# Patient Record
Sex: Male | Born: 1995 | Race: White | Hispanic: No | Marital: Single | Smoking: Current every day smoker
Health system: Southern US, Community
[De-identification: ages and names within clinical notes are randomized; demographics above are authoritative.]

## PROBLEM LIST (undated history)

## (undated) HISTORY — PX: NO PAST SURGERIES: SHX2092

---

## 2017-07-04 ENCOUNTER — Ambulatory Visit (INDEPENDENT_AMBULATORY_CARE_PROVIDER_SITE_OTHER): Payer: Self-pay | Admitting: Allergy and Immunology

## 2017-07-04 ENCOUNTER — Encounter: Payer: Self-pay | Admitting: Allergy and Immunology

## 2017-07-04 VITALS — BP 142/64 | HR 64 | Temp 98.1°F | Resp 20 | Ht 72.0 in | Wt 157.0 lb

## 2017-07-04 DIAGNOSIS — Z880 Allergy status to penicillin: Secondary | ICD-10-CM

## 2017-07-04 MED ORDER — PENICILLIN V POTASSIUM 125 MG/5ML PO SOLR: ORAL | 0 refills | Status: DC

## 2017-07-04 NOTE — Progress Notes (Signed)
NEW PATIENT NOTE  Referring Provider: No ref. provider found Primary Provider: No primary care provider on file. Date of office visit: 07/04/2017    Subjective:   Chief Complaint:  Kevin Burton (DOB: January 24, 1996) is a 21 y.o. male who presents to the clinic on 07/04/2017 with a chief complaint of No chief complaint on file. Marland Kitchen  HPI: Kevin Burton presents to this clinic in evaluation of possible penicillin allergy. He has never had a reaction to penicillin administration and he is not sure that he has ever had administration of a antibiotic related to penicillin. Basically because of a strong family history of penicillin hypersensitivity he has never had exposure to penicillin. He does not have an atopic history. He is applying to the [employer]	[place] and this issue of possible penicillin allergy must be rectified.  History reviewed. No pertinent past medical history.  Past Surgical History:  Procedure Laterality Date  . NO PAST SURGERIES      Allergies as of 07/04/2017      Reactions   Penicillins    Family history      Medication List      none   Review of systems negative except as noted in HPI / PMHx or noted below:  Review of Systems  Constitutional: Negative.   HENT: Negative.   Eyes: Negative.   Respiratory: Negative.   Cardiovascular: Negative.   Gastrointestinal: Negative.   Genitourinary: Negative.   Musculoskeletal: Negative.   Skin: Negative.   Neurological: Negative.   Endo/Heme/Allergies: Negative.   Psychiatric/Behavioral: Negative.     Family History  Problem Relation Age of Onset  . Other Brother        penicillin allergy-severe anaphylactic reaction    Social History   Social History  . Marital status: N/A    Spouse name: N/A  . Number of children: N/A  . Years of education: N/A   Occupational History  . Not on file.   Social History Main Topics  . Smoking status: Current Every Day Smoker    Packs/day: 0.25    Years: 3.00    Types:  Cigarettes  . Smokeless tobacco: Never Used  . Alcohol use No  . Drug use: No  . Sexual activity: Not on file   Other Topics Concern  . Not on file   Social History Narrative  . No narrative on file    Environmental and Social history  Lives in a house with a dry environment, dogs located inside the household, carpeting in the bedroom, no plastic on the bed, no plastic on the pillow, actually smoking tobacco products, and employment as un-armed security.  Objective:   Vitals:   07/04/17 1354  BP: (!) 142/64  Pulse: 64  Resp: 20  Temp: 98.1 F (36.7 C)   Height: 6' (182.9 cm) Weight: 157 lb (71.2 kg)  Physical Exam  Constitutional: He is well-developed, well-nourished, and in no distress.  HENT:  Head: Normocephalic. Head is without right periorbital erythema and without left periorbital erythema.  Right Ear: Tympanic membrane, external ear and ear canal normal.  Left Ear: Tympanic membrane, external ear and ear canal normal.  Nose: Nose normal. No mucosal edema or rhinorrhea.  Mouth/Throat: Uvula is midline, oropharynx is clear and moist and mucous membranes are normal. No oropharyngeal exudate.  Eyes: Pupils are equal, round, and reactive to light. Conjunctivae and lids are normal.  Neck: Trachea normal. No tracheal tenderness present. No tracheal deviation present. No thyromegaly present.  Cardiovascular: Normal  rate, regular rhythm, S1 normal, S2 normal and normal heart sounds.   No murmur heard. Pulmonary/Chest: Effort normal and breath sounds normal. No stridor. No tachypnea. No respiratory distress. He has no wheezes. He has no rales. He exhibits no tenderness.  Abdominal: Soft. He exhibits no distension and no mass. There is no hepatosplenomegaly. There is no tenderness. There is no rebound and no guarding.  Musculoskeletal: He exhibits no edema or tenderness.  Lymphadenopathy:       Head (right side): No tonsillar adenopathy present.       Head (left side): No  tonsillar adenopathy present.    He has no cervical adenopathy.    He has no axillary adenopathy.  Neurological: He is alert. Gait normal.  Skin: No rash noted. He is not diaphoretic. No erythema. No pallor. Nails show no clubbing.  Psychiatric: Mood and affect normal.    Diagnostics:  none   Assessment and Plan:    1. History of penicillin allergy    I think the best way to work through this issue is to have Kevin Burton undergo a penicillin oral challenge in the clinic and we will get that arranged this week. Given the lack of a history suggesting penicillin allergy I think we can move right to an incremental drug challenge rather than working through penicillin sensitivity with skin testing.  Laurette SchimkeEric Akaysha Cobern, MD Allergy / Immunology [facility]

## 2017-07-06 ENCOUNTER — Encounter: Payer: Self-pay | Admitting: Allergy and Immunology

## 2017-07-09 ENCOUNTER — Ambulatory Visit: Payer: Self-pay | Admitting: Allergy and Immunology

## 2017-07-09 ENCOUNTER — Encounter: Payer: Self-pay | Admitting: Allergy and Immunology

## 2017-07-09 VITALS — BP 124/80 | HR 60 | Resp 18

## 2017-07-09 DIAGNOSIS — Z880 Allergy status to penicillin: Secondary | ICD-10-CM

## 2017-07-09 NOTE — Progress Notes (Signed)
Kevin Burton returns to this clinic to have a oral penicillin challenge administered. Utilizing an incremental challenge protocol he was given pen VK over the course of 3 hours with a total exposure of 500 milligrams without the development of any adverse effect. The label of penicillin allergy should be removed from his medical chart.

## 2017-07-11 ENCOUNTER — Encounter: Payer: Self-pay | Admitting: *Deleted

## 2019-09-13 ENCOUNTER — Other Ambulatory Visit: Payer: Self-pay

## 2019-09-13 ENCOUNTER — Emergency Department

## 2019-09-13 ENCOUNTER — Emergency Department
Admission: EM | Admit: 2019-09-13 | Discharge: 2019-09-13 | Disposition: A | Discharge status: Home / Self Care | Attending: Emergency Medicine | Admitting: Emergency Medicine

## 2019-09-13 DIAGNOSIS — S0990XA Unspecified injury of head, initial encounter: Secondary | ICD-10-CM | POA: Insufficient documentation

## 2019-09-13 DIAGNOSIS — Y999 Unspecified external cause status: Secondary | ICD-10-CM | POA: Insufficient documentation

## 2019-09-13 DIAGNOSIS — W19XXXA Unspecified fall, initial encounter: Secondary | ICD-10-CM | POA: Insufficient documentation

## 2019-09-13 DIAGNOSIS — F17210 Nicotine dependence, cigarettes, uncomplicated: Secondary | ICD-10-CM | POA: Diagnosis not present

## 2019-09-13 DIAGNOSIS — R4182 Altered mental status, unspecified: Secondary | ICD-10-CM | POA: Insufficient documentation

## 2019-09-13 DIAGNOSIS — Y929 Unspecified place or not applicable: Secondary | ICD-10-CM | POA: Diagnosis not present

## 2019-09-13 DIAGNOSIS — Y907 Blood alcohol level of 200-239 mg/100 ml: Secondary | ICD-10-CM | POA: Diagnosis not present

## 2019-09-13 DIAGNOSIS — F10929 Alcohol use, unspecified with intoxication, unspecified: Secondary | ICD-10-CM | POA: Insufficient documentation

## 2019-09-13 DIAGNOSIS — Y939 Activity, unspecified: Secondary | ICD-10-CM | POA: Diagnosis not present

## 2019-09-13 LAB — COMPREHENSIVE METABOLIC PANEL
ALT: 52 U/L — ABNORMAL HIGH (ref 0–44)
AST: 34 U/L (ref 15–41)
Albumin: 4.8 g/dL (ref 3.5–5.0)
Alkaline Phosphatase: 73 U/L (ref 38–126)
Anion gap: 12 (ref 5–15)
BUN: 12 mg/dL (ref 6–20)
CO2: 25 mmol/L (ref 22–32)
Calcium: 8.9 mg/dL (ref 8.9–10.3)
Chloride: 102 mmol/L (ref 98–111)
Creatinine, Ser: 0.77 mg/dL (ref 0.61–1.24)
GFR calc Af Amer: >60 mL/min (ref >60)
GFR calc non Af Amer: >60 mL/min (ref >60)
Glucose, Bld: 129 mg/dL — ABNORMAL HIGH (ref 70–99)
Potassium: 3.1 mmol/L — ABNORMAL LOW (ref 3.5–5.1)
Sodium: 139 mmol/L (ref 135–145)
Total Bilirubin: 0.6 mg/dL (ref 0.3–1.2)
Total Protein: 8.1 g/dL (ref 6.5–8.1)

## 2019-09-13 LAB — CBC
HCT: 47.1 % (ref 39.0–52.0)
Hemoglobin: 16 g/dL (ref 13.0–17.0)
MCH: 29.9 pg (ref 26.0–34.0)
MCHC: 34 g/dL (ref 30.0–36.0)
MCV: 88 fL (ref 80.0–100.0)
Platelets: 229 10*3/uL (ref 150–400)
RBC: 5.35 MIL/uL (ref 4.22–5.81)
RDW: 11.4 % — ABNORMAL LOW (ref 11.5–15.5)
WBC: 10.7 10*3/uL — ABNORMAL HIGH (ref 4.0–10.5)
nRBC: 0 % (ref 0.0–0.2)

## 2019-09-13 LAB — GLUCOSE, CAPILLARY: Glucose-Capillary: 117 mg/dL — ABNORMAL HIGH (ref 70–99)

## 2019-09-13 LAB — ETHANOL: Alcohol, Ethyl (B): 233 mg/dL — ABNORMAL HIGH (ref <10)

## 2019-09-13 MED ORDER — ONDANSETRON HCL 4 MG/2ML IJ SOLN
INTRAMUSCULAR | Status: AC
  Administered 2019-09-13: 03:00:00 4 mg via INTRAVENOUS
  Filled 2019-09-13: qty 2

## 2019-09-13 MED ORDER — ONDANSETRON HCL 4 MG/2ML IJ SOLN
4.0000 mg | Freq: Once | INTRAMUSCULAR | Status: AC
  Administered 2019-09-13: 03:00:00 4 mg via INTRAVENOUS

## 2019-09-13 MED ORDER — ONDANSETRON HCL 4 MG/2ML IJ SOLN
4.0000 mg | Freq: Once | INTRAMUSCULAR | Status: AC
  Administered 2019-09-13: 4 mg via INTRAVENOUS

## 2019-09-13 MED ORDER — SODIUM CHLORIDE 0.9 % IV BOLUS
1000.0000 mL | Freq: Once | INTRAVENOUS | Status: AC
  Administered 2019-09-13: 1000 mL via INTRAVENOUS

## 2019-09-13 MED ORDER — ONDANSETRON HCL 4 MG/2ML IJ SOLN
INTRAMUSCULAR | Status: AC
  Filled 2019-09-13: qty 2

## 2019-09-13 NOTE — ED Notes (Signed)
Pt in ct vomiting. md notified, order for additional zofran received.

## 2019-09-13 NOTE — ED Notes (Signed)
Heather RN made aware. Pt to be placed in room 11 when bed available.

## 2019-09-13 NOTE — ED Provider Notes (Signed)
[facility] Emergency Department Provider Note _____________   First MD Initiated Contact with Patient 09/13/19 0228     (approximate)  I have reviewed the triage vital signs and the nursing notes.  History obtained from the patient significant other at bedside secondary to alcohol intoxication Level 5 caveat history review of system limited secondary to alcohol intoxication HISTORY  Chief Complaint Alcohol Intoxication and Head Injury   HPI Kevin Burton is a 23 y.o. male presents to the emergency department secondary to 2 falls tonight with head injury approximately 40 minutes apart.  Patient's significant other at bedside states that he has had approximately 3 beers 2 Jaeger shots and a small bottle of whiskey tonight.  She admits that he has had multiple episodes of vomiting as well.        History reviewed. No pertinent past medical history.  There are no active problems to display for this patient.   Past Surgical History:  Procedure Laterality Date  . NO PAST SURGERIES      Prior to Admission medications   Not on File    Allergies Penicillins  Family History  Problem Relation Age of Onset  . Other Brother        penicillin allergy-severe anaphylactic reaction    Social History Social History   Tobacco Use  . Smoking status: Current Every Day Smoker    Packs/day: 0.25    Years: 3.00    Pack years: 0.75    Types: Cigarettes  . Smokeless tobacco: Never Used  Substance Use Topics  . Alcohol use: Yes  . Drug use: No    Review of Systems Constitutional: No fever/chills Eyes: No visual changes. ENT: No sore throat. Cardiovascular: Denies chest pain. Respiratory: Denies shortness of breath. Gastrointestinal: No abdominal pain.  No nausea, no vomiting.  No diarrhea.  No constipation. Genitourinary: Negative for dysuria. Musculoskeletal: Negative for neck pain.  Negative for back pain. Integumentary: Negative for rash.  Neurological: Negative for headaches, focal weakness or numbness. Psychiatric:  Positive for heavy EtOH ingestion   ____________________________________________   PHYSICAL EXAM:  VITAL SIGNS: ED Triage Vitals  Enc Vitals Group     BP 09/13/19 0212 (!) 141/69     Pulse Rate 09/13/19 0212 76     Resp 09/13/19 0212 18     Temp --      Temp src --      SpO2 09/13/19 0212 98 %     Weight 09/13/19 0213 88.5 kg (195 lb)     Height 09/13/19 0213 1.88 m (6\' 2" )     Head Circumference --      Peak Flow --      Pain Score --      Pain Loc --      Pain Edu? --      Excl. in [place]? --     Constitutional: Alert, appears intoxicated, emesis noted on the patient's clothing Eyes: Conjunctivae are normal.  Head: Atraumatic. Mouth/Throat: Patient is wearing a mask. Neck: No stridor.  No meningeal signs.   Cardiovascular: Normal rate, regular rhythm. Good peripheral circulation. Grossly normal heart sounds. Respiratory: Normal respiratory effort.  No retractions. Gastrointestinal: Soft and nontender. No distention.  Musculoskeletal: No lower extremity tenderness nor edema. No gross deformities of extremities. Neurologic: No gross focal neurologic deficits are appreciated.  Skin:  Skin is warm, dry and intact. Psychiatric: Appears intoxicated  ____________________________________________   LABS (all labs ordered are listed, but only abnormal results are displayed)  Labs Reviewed  CBC - Abnormal; Notable for the following components:      Result Value   WBC 10.7 (*)    RDW 11.4 (*)    All other components within normal limits  ETHANOL - Abnormal; Notable for the following components:   Alcohol, Ethyl (B) 233 (*)    All other components within normal limits  GLUCOSE, CAPILLARY - Abnormal; Notable for the following components:   Glucose-Capillary 117 (*)    All other components within normal limits  COMPREHENSIVE METABOLIC PANEL   ____________________________________________  EKG   ED ECG REPORT I, [place] N Sheneka Schrom, the attending physician, personally viewed and interpreted this ECG.   Date: 09/13/2019  EKG Time: 2:29 AM  Rate: 85  Rhythm: Normal Sinus Rhythm  Axis: Normal  Intervals:Normal   ST&T Change: None  ____________________________________________  RADIOLOGY I, [place] N Demba Nigh, personally viewed and evaluated these images (plain radiographs) as part of my medical decision making, as well as reviewing the written report by the radiologist.  ED MD interpretation: Normal head CT per radiologist.  Official radiology report(s): Ct Head Wo Contrast  Result Date: 09/13/2019 CLINICAL DATA:  Recent fall while intoxicated EXAM: CT HEAD WITHOUT CONTRAST TECHNIQUE: Contiguous axial images were obtained from the base of the skull through the vertex without intravenous contrast. COMPARISON:  None. FINDINGS: Brain: No evidence of acute infarction, hemorrhage, hydrocephalus, extra-axial collection or mass lesion/mass effect. Vascular: No hyperdense vessel or unexpected calcification. Skull: Normal. Negative for fracture or focal lesion. Sinuses/Orbits: No acute finding. Other: None. IMPRESSION: Normal head CT Electronically Signed   By: Alcide CleverMark  Lukens M.D.   On: 09/13/2019 03:21      Procedures   ____________________________________________   INITIAL IMPRESSION / MDM / ASSESSMENT AND PLAN / ED COURSE  As part of my medical decision making, I reviewed the following data within the electronic MEDICAL RECORD NUMBER  23 year old male presenting with above-stated history and physical exam secondary to resumed EtOH intoxication and falls with head injury.  CT scan of the head revealed no acute intracranial abnormality.  Patient's EtOH level 233.  Patient had additional episodes of vomiting in the emergency department for which he received in total of 2 doses of Zofran 4 mg IV no vomiting at present.  Patient also received 1 L IV normal saline resting comfortably at present   ____________________________________________  FINAL CLINICAL IMPRESSION(S) / ED DIAGNOSES  Final diagnoses:  Alcoholic intoxication with complication (HCC)     MEDICATIONS GIVEN DURING THIS VISIT:  Medications  sodium chloride 0.9 % bolus 1,000 mL (1,000 mLs Intravenous New Bag/Given 09/13/19 0243)  ondansetron (ZOFRAN) injection 4 mg (4 mg Intravenous Given 09/13/19 0231)  ondansetron (ZOFRAN) injection 4 mg (4 mg Intravenous Given 09/13/19 0310)     ED Discharge Orders    None      *Please note:  Velda ShellJames Lindholm was evaluated in Emergency Department on 09/13/2019 for the symptoms described in the history of present illness. He was evaluated in the context of the global COVID-19 pandemic, which necessitated consideration that the patient might be at risk for infection with the SARS-CoV-2 virus that causes COVID-19. Institutional protocols and algorithms that pertain to the evaluation of patients at risk for COVID-19 are in a state of rapid change based on information released by regulatory bodies including the CDC and federal and state organizations. These policies and algorithms were followed during the patient's care in the ED.  Some ED evaluations and interventions may be delayed as a result  of limited staffing during the pandemic.*  Note:  This document was prepared using Dragon voice recognition software and may include unintentional dictation errors.   Darci Current, MD 09/13/19 [phone]

## 2019-09-13 NOTE — ED Triage Notes (Addendum)
Pt arrives to ED via POV from home with c/o ETOH intoxication and head injury after 2 falls "40 minutes apart". Unknown LOC d/t pt's level of intoxication. Pt's wife reports pt drank "2-3 beers, 2 Yeager-shots, and a small bottle of whiskey". Wife reports 6 episodes of emesis PTA. Pt is snoring, unconscious, severely intoxicated and unable to provide any information.

## 2020-03-17 IMAGING — CT CT HEAD W/O CM
3 series · 16 of 46 positions shown, 19 images · non-contrast
Comparison: None.

CLINICAL DATA: Recent fall while intoxicated

EXAM:
CT HEAD WITHOUT CONTRAST
TECHNIQUE: Contiguous axial images were obtained from the base of the skull
through the vertex without intravenous contrast.

[Series 3: head wo · axial · 0.43mm/px · z∈[-79,+41]mm · 10 of 29 slices shown, 13 images]
[im 3/29  brain]
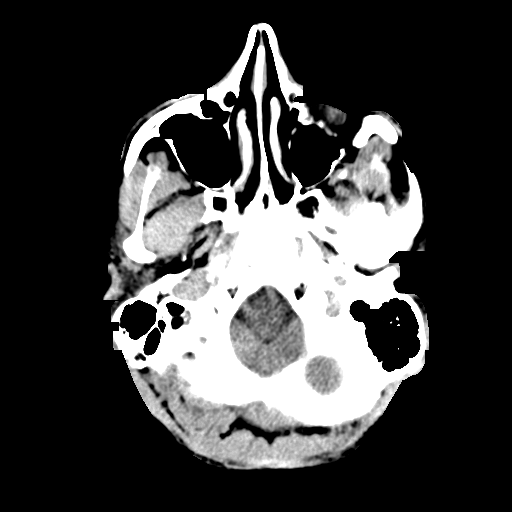
[im 3/29  bone]
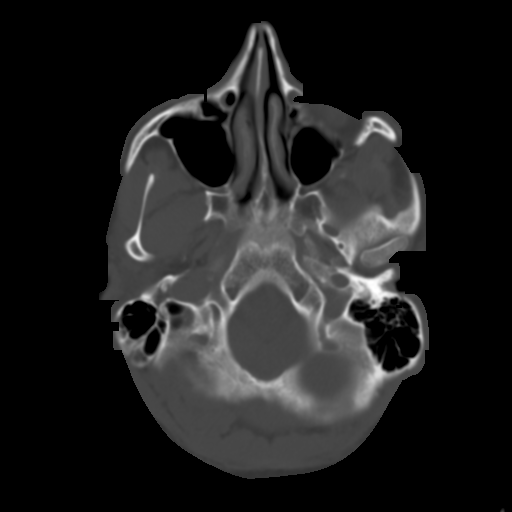
[im 6/29  brain]
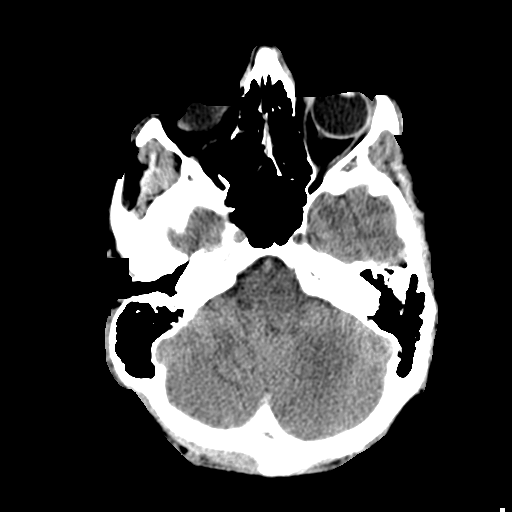
[im 8/29  brain]
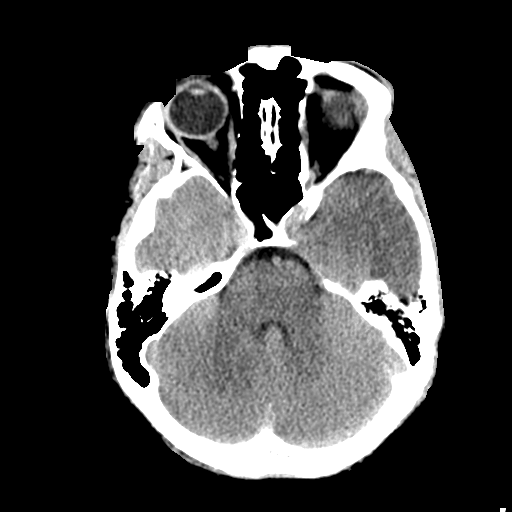
[im 11/29  brain]
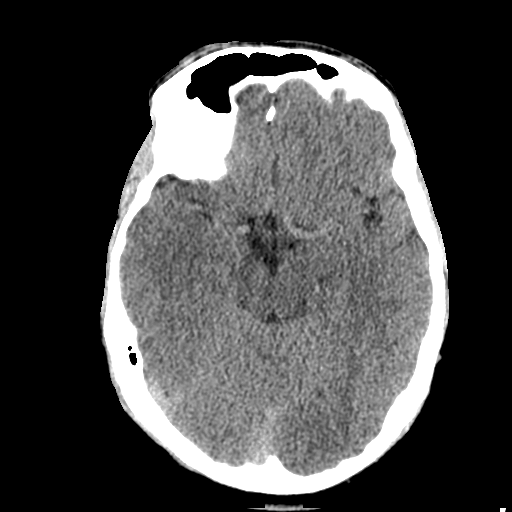
[im 14/29  brain]
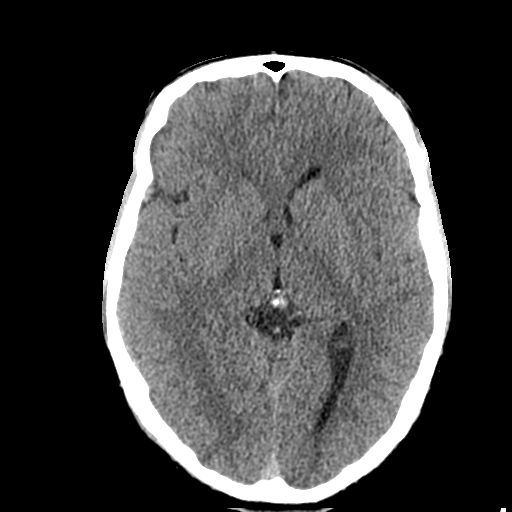
[im 14/29  bone]
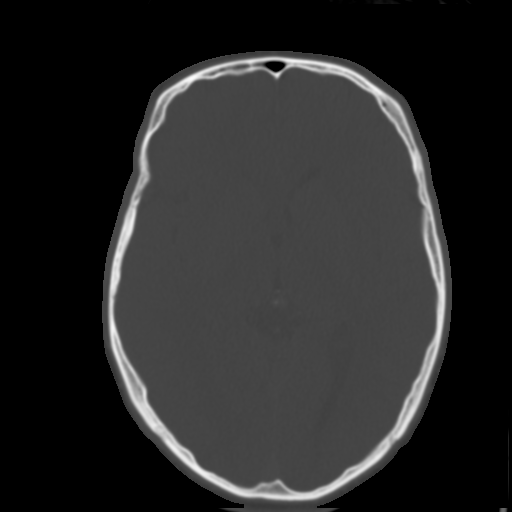
[im 16/29  brain]
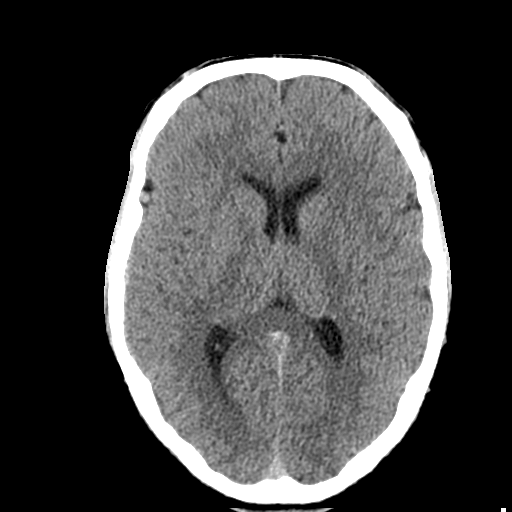
[im 19/29  brain]
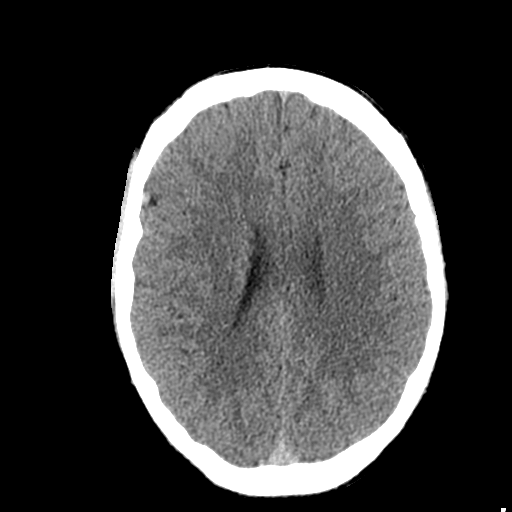
[im 22/29  brain]
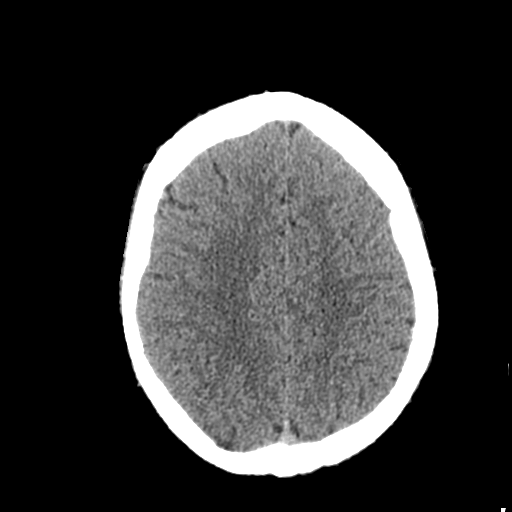
[im 24/29  brain]
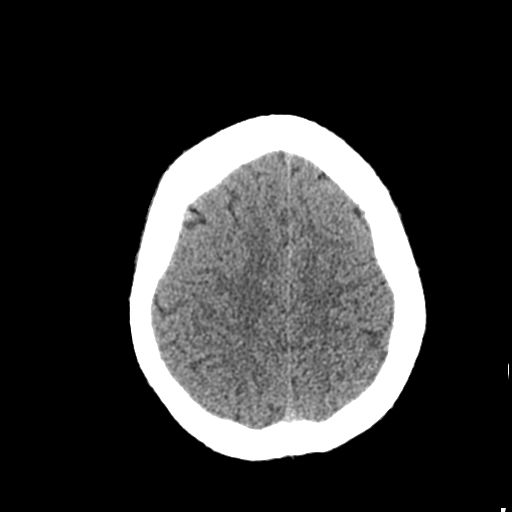
[im 24/29  bone]
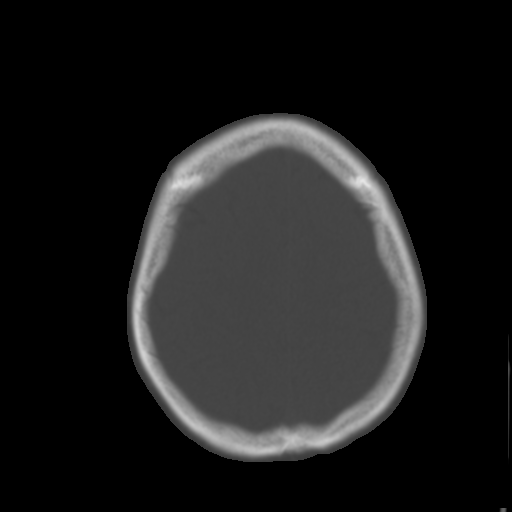
[im 27/29  brain]
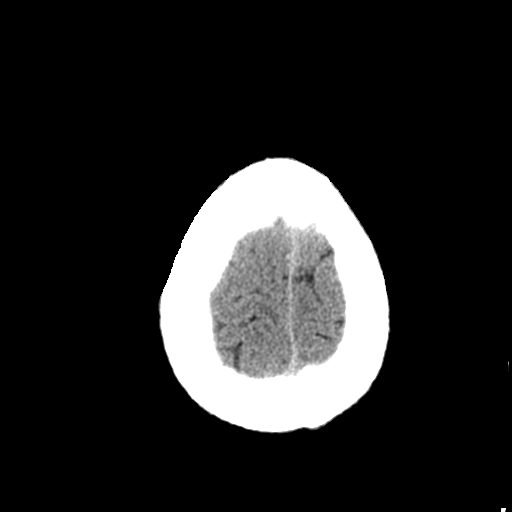

[Series 4: coronal soft tissue · coronal · 0.32mm/px · 3 of 65 slices shown]
[im 22/65  brain]
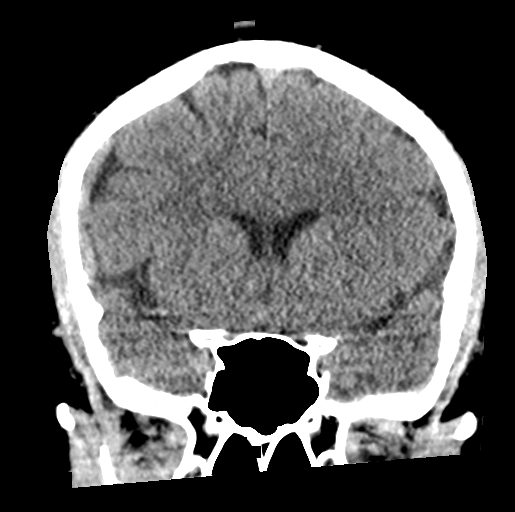
[im 29/65  brain]
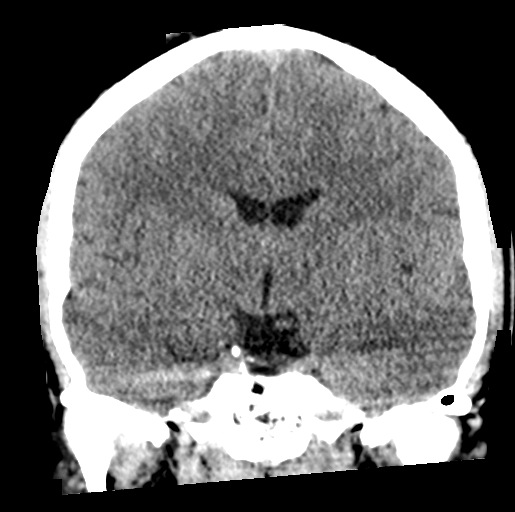
[im 36/65  brain]
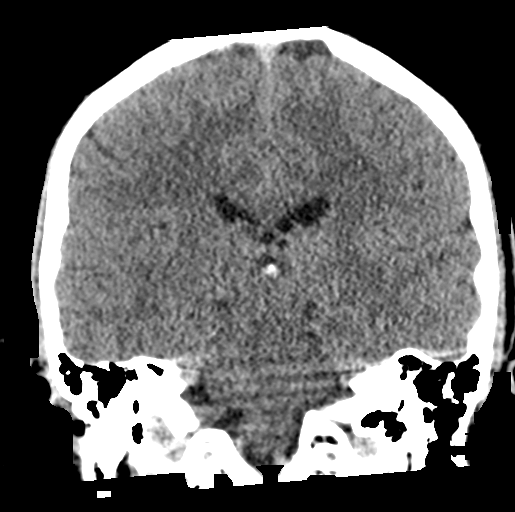

[Series 5: sagittal soft tissue · sagittal · 0.29mm/px · 3 of 50 slices shown]
[im 17/50  brain]
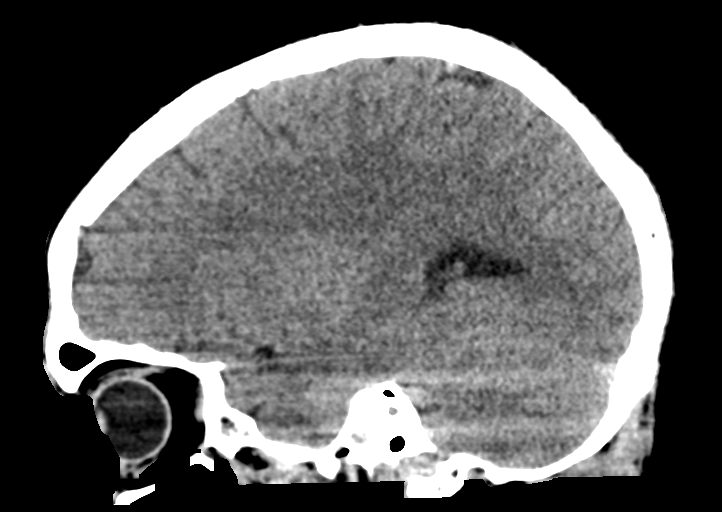
[im 25/50  brain]
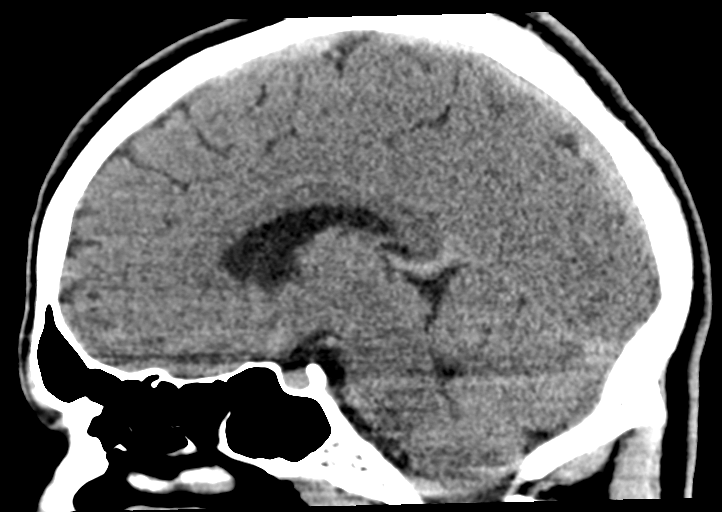
[im 33/50  brain]
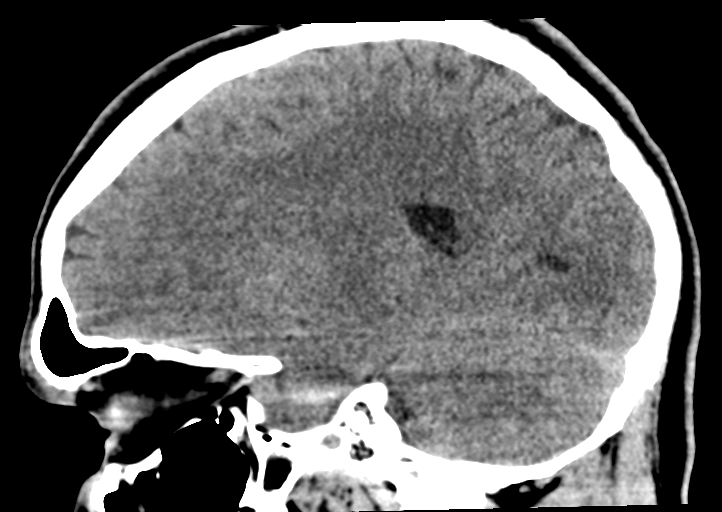

[16 of 46 positions shown; findings below may reference images not displayed]

FINDINGS: Brain: No evidence of acute infarction, hemorrhage, hydrocephalus,
extra-axial collection or mass lesion/mass effect.

Vascular: No hyperdense vessel or unexpected calcification.

Skull: Normal. Negative for fracture or focal lesion.

Sinuses/Orbits: No acute finding.

Other: None.
IMPRESSION: Normal head CT
